# Patient Record
Sex: Female | Born: 1955
Health system: Southern US, Community
[De-identification: ages and names within clinical notes are randomized; demographics above are authoritative.]

## PROBLEM LIST (undated history)

## (undated) DIAGNOSIS — C9110 Chronic lymphocytic leukemia of B-cell type not having achieved remission: Secondary | ICD-10-CM

## (undated) DIAGNOSIS — I8392 Asymptomatic varicose veins of left lower extremity: Secondary | ICD-10-CM

## (undated) HISTORY — DX: Chronic lymphocytic leukemia of B-cell type not having achieved remission: C91.10

## (undated) HISTORY — PX: SHOULDER SURGERY: SHX246

## (undated) HISTORY — DX: Asymptomatic varicose veins of left lower extremity: I83.92

## (undated) HISTORY — PX: TUBAL LIGATION: SHX77

## (undated) HISTORY — PX: ELBOW SURGERY: SHX618

---

## 1998-05-03 ENCOUNTER — Other Ambulatory Visit: Admission: RE | Admit: 1998-05-03 | Discharge: 1998-05-03 | Payer: Self-pay | Admitting: Gynecology

## 1998-07-10 ENCOUNTER — Encounter: Payer: Self-pay | Admitting: General Surgery

## 1998-07-10 ENCOUNTER — Ambulatory Visit (HOSPITAL_COMMUNITY): Admission: RE | Admit: 1998-07-10 | Discharge: 1998-07-10 | Payer: Self-pay | Admitting: General Surgery

## 1999-07-05 ENCOUNTER — Other Ambulatory Visit: Admission: RE | Admit: 1999-07-05 | Discharge: 1999-07-05 | Payer: Self-pay | Admitting: Gynecology

## 1999-08-30 ENCOUNTER — Encounter: Admission: RE | Admit: 1999-08-30 | Discharge: 1999-08-30 | Payer: Self-pay | Admitting: Gynecology

## 1999-08-30 ENCOUNTER — Encounter: Payer: Self-pay | Admitting: Gynecology

## 1999-10-15 ENCOUNTER — Other Ambulatory Visit: Admission: RE | Admit: 1999-10-15 | Discharge: 1999-10-15 | Payer: Self-pay | Admitting: Gynecology

## 2000-10-01 ENCOUNTER — Encounter: Payer: Self-pay | Admitting: Gynecology

## 2000-10-01 ENCOUNTER — Encounter: Admission: RE | Admit: 2000-10-01 | Discharge: 2000-10-01 | Payer: Self-pay | Admitting: Gynecology

## 2000-11-11 ENCOUNTER — Other Ambulatory Visit: Admission: RE | Admit: 2000-11-11 | Discharge: 2000-11-11 | Payer: Self-pay | Admitting: Gynecology

## 2001-11-30 ENCOUNTER — Encounter: Payer: Self-pay | Admitting: Gynecology

## 2001-11-30 ENCOUNTER — Encounter: Admission: RE | Admit: 2001-11-30 | Discharge: 2001-11-30 | Payer: Self-pay | Admitting: Gynecology

## 2002-01-05 ENCOUNTER — Other Ambulatory Visit: Admission: RE | Admit: 2002-01-05 | Discharge: 2002-01-05 | Payer: Self-pay | Admitting: Gynecology

## 2003-03-29 ENCOUNTER — Encounter: Admission: RE | Admit: 2003-03-29 | Discharge: 2003-03-29 | Payer: Self-pay | Admitting: Gynecology

## 2003-03-29 ENCOUNTER — Encounter: Payer: Self-pay | Admitting: Gynecology

## 2003-03-29 ENCOUNTER — Other Ambulatory Visit: Admission: RE | Admit: 2003-03-29 | Discharge: 2003-03-29 | Payer: Self-pay | Admitting: Gynecology

## 2004-05-22 ENCOUNTER — Encounter: Admission: RE | Admit: 2004-05-22 | Discharge: 2004-05-22 | Payer: Self-pay | Admitting: Gynecology

## 2004-05-22 ENCOUNTER — Other Ambulatory Visit: Admission: RE | Admit: 2004-05-22 | Discharge: 2004-05-22 | Payer: Self-pay | Admitting: Gynecology

## 2005-08-20 ENCOUNTER — Other Ambulatory Visit: Admission: RE | Admit: 2005-08-20 | Discharge: 2005-08-20 | Payer: Self-pay | Admitting: Gynecology

## 2005-08-20 ENCOUNTER — Encounter: Admission: RE | Admit: 2005-08-20 | Discharge: 2005-08-20 | Payer: Self-pay | Admitting: Gynecology

## 2006-09-01 ENCOUNTER — Encounter: Admission: RE | Admit: 2006-09-01 | Discharge: 2006-09-01 | Payer: Self-pay | Admitting: Gynecology

## 2006-09-01 ENCOUNTER — Other Ambulatory Visit: Admission: RE | Admit: 2006-09-01 | Discharge: 2006-09-01 | Payer: Self-pay | Admitting: Gynecology

## 2007-12-10 ENCOUNTER — Other Ambulatory Visit: Admission: RE | Admit: 2007-12-10 | Discharge: 2007-12-10 | Payer: Self-pay | Admitting: Gynecology

## 2008-02-03 ENCOUNTER — Encounter: Admission: RE | Admit: 2008-02-03 | Discharge: 2008-02-03 | Payer: Self-pay | Admitting: Gynecology

## 2009-03-08 ENCOUNTER — Encounter: Admission: RE | Admit: 2009-03-08 | Discharge: 2009-03-08 | Payer: Self-pay | Admitting: General Surgery

## 2009-03-22 ENCOUNTER — Encounter: Admission: RE | Admit: 2009-03-22 | Discharge: 2009-03-22 | Payer: Self-pay | Admitting: General Surgery

## 2010-08-05 ENCOUNTER — Encounter: Payer: Self-pay | Admitting: General Surgery

## 2011-09-10 ENCOUNTER — Other Ambulatory Visit: Payer: Self-pay | Admitting: Gynecology

## 2011-09-10 DIAGNOSIS — R928 Other abnormal and inconclusive findings on diagnostic imaging of breast: Secondary | ICD-10-CM

## 2011-09-12 ENCOUNTER — Ambulatory Visit
Admission: RE | Admit: 2011-09-12 | Discharge: 2011-09-12 | Disposition: A | Discharge status: Home / Self Care | Payer: BC Managed Care – PPO | Source: Ambulatory Visit | Attending: Gynecology | Admitting: Gynecology

## 2011-09-12 DIAGNOSIS — R928 Other abnormal and inconclusive findings on diagnostic imaging of breast: Secondary | ICD-10-CM

## 2015-09-28 ENCOUNTER — Other Ambulatory Visit: Payer: Self-pay | Admitting: Obstetrics and Gynecology

## 2015-09-28 DIAGNOSIS — N6459 Other signs and symptoms in breast: Secondary | ICD-10-CM

## 2015-10-04 ENCOUNTER — Other Ambulatory Visit: Payer: Self-pay

## 2015-10-16 ENCOUNTER — Ambulatory Visit
Admission: RE | Admit: 2015-10-16 | Discharge: 2015-10-16 | Disposition: A | Discharge status: Home / Self Care | Payer: BLUE CROSS/BLUE SHIELD | Source: Ambulatory Visit | Attending: Obstetrics and Gynecology | Admitting: Obstetrics and Gynecology

## 2015-10-16 DIAGNOSIS — N6459 Other signs and symptoms in breast: Secondary | ICD-10-CM

## 2017-07-21 DIAGNOSIS — J189 Pneumonia, unspecified organism: Secondary | ICD-10-CM | POA: Diagnosis not present

## 2017-07-21 DIAGNOSIS — Z6841 Body Mass Index (BMI) 40.0 and over, adult: Secondary | ICD-10-CM | POA: Diagnosis not present

## 2017-07-21 DIAGNOSIS — R7989 Other specified abnormal findings of blood chemistry: Secondary | ICD-10-CM | POA: Diagnosis not present

## 2017-08-18 DIAGNOSIS — J343 Hypertrophy of nasal turbinates: Secondary | ICD-10-CM | POA: Diagnosis not present

## 2017-08-18 DIAGNOSIS — R590 Localized enlarged lymph nodes: Secondary | ICD-10-CM | POA: Diagnosis not present

## 2017-08-18 DIAGNOSIS — J301 Allergic rhinitis due to pollen: Secondary | ICD-10-CM | POA: Diagnosis not present

## 2017-08-18 DIAGNOSIS — J3489 Other specified disorders of nose and nasal sinuses: Secondary | ICD-10-CM | POA: Diagnosis not present

## 2017-08-25 ENCOUNTER — Ambulatory Visit (INDEPENDENT_AMBULATORY_CARE_PROVIDER_SITE_OTHER): Payer: BLUE CROSS/BLUE SHIELD

## 2017-08-25 ENCOUNTER — Ambulatory Visit (INDEPENDENT_AMBULATORY_CARE_PROVIDER_SITE_OTHER): Payer: BLUE CROSS/BLUE SHIELD | Admitting: Podiatry

## 2017-08-25 ENCOUNTER — Encounter: Payer: Self-pay | Admitting: Podiatry

## 2017-08-25 VITALS — Ht 67.0 in | Wt 275.0 lb

## 2017-08-25 DIAGNOSIS — M79671 Pain in right foot: Secondary | ICD-10-CM

## 2017-08-25 DIAGNOSIS — M779 Enthesopathy, unspecified: Secondary | ICD-10-CM | POA: Diagnosis not present

## 2017-09-01 ENCOUNTER — Ambulatory Visit: Payer: Self-pay | Admitting: Podiatry

## 2017-09-08 NOTE — Progress Notes (Signed)
  Subjective:  Patient ID: Amy Good, female    DOB: 03-01-1956,  MRN: [id number]  Chief Complaint  Patient presents with  . Foot Pain    outside right foot pain up and across the top   aches and throbs on going for 1 year    62 y.o. female presents with the above complaint.  Reports pain in the outside of the right foot and on top of the foot.  States that things drop all the time.  Has been going on for greater than 1 year.  Denies prior treatments.  No past medical history on file.  Current Outpatient Medications:  Marland Kitchen  Vitamin D, Ergocalciferol, (DRISDOL) 50000 units CAPS capsule, TAKE 1 (ONE) CAPSULE BY MOUTH BY MOUTH ONCE WEEKLY, Disp: , Rfl: 1  No Known Allergies Review of Systems Objective:  There were no vitals filed for this visit. General AA&O x3. Normal mood and affect.  Vascular Dorsalis pedis and posterior tibial pulses  present 2+ bilaterally  Capillary refill normal to all digits. Pedal hair growth normal.  Neurologic Epicritic sensation grossly present.  Dermatologic No open lesions. Interspaces clear of maceration. Nails well groomed and normal in appearance.  Orthopedic: MMT 5/5 in dorsiflexion, plantarflexion, inversion, and eversion. Normal joint ROM without pain or crepitus. Pain to palpation right fifth metatarsal base   Assessment & Plan:  Patient was evaluated and treated and all questions answered.  R 5th Metatarsal base enthesitis -X-rays taken and reviewed.  No acute osseous abnormality.  Fifth metatarsal base and the site is noted -Educated on padding.  Padding was placed -No injection today.  Would consider in future  Return in about 4 weeks (around 09/22/2017) for Tendonitis, Enthesitis f/u.

## 2017-09-11 ENCOUNTER — Other Ambulatory Visit: Payer: Self-pay | Admitting: Podiatry

## 2017-09-11 DIAGNOSIS — M79671 Pain in right foot: Secondary | ICD-10-CM

## 2017-09-11 DIAGNOSIS — M779 Enthesopathy, unspecified: Secondary | ICD-10-CM

## 2017-09-15 DIAGNOSIS — R591 Generalized enlarged lymph nodes: Secondary | ICD-10-CM | POA: Diagnosis not present

## 2017-09-15 DIAGNOSIS — R590 Localized enlarged lymph nodes: Secondary | ICD-10-CM | POA: Diagnosis not present

## 2017-09-22 DIAGNOSIS — R590 Localized enlarged lymph nodes: Secondary | ICD-10-CM | POA: Diagnosis not present

## 2017-09-22 DIAGNOSIS — J343 Hypertrophy of nasal turbinates: Secondary | ICD-10-CM | POA: Diagnosis not present

## 2017-09-22 DIAGNOSIS — J301 Allergic rhinitis due to pollen: Secondary | ICD-10-CM | POA: Diagnosis not present

## 2017-09-23 ENCOUNTER — Ambulatory Visit: Payer: BLUE CROSS/BLUE SHIELD | Admitting: Podiatry

## 2017-09-24 DIAGNOSIS — C9110 Chronic lymphocytic leukemia of B-cell type not having achieved remission: Secondary | ICD-10-CM | POA: Diagnosis not present

## 2017-09-24 DIAGNOSIS — R590 Localized enlarged lymph nodes: Secondary | ICD-10-CM | POA: Diagnosis not present

## 2017-09-24 DIAGNOSIS — C8300 Small cell B-cell lymphoma, unspecified site: Secondary | ICD-10-CM | POA: Diagnosis not present

## 2017-10-20 DIAGNOSIS — C9110 Chronic lymphocytic leukemia of B-cell type not having achieved remission: Secondary | ICD-10-CM | POA: Diagnosis not present

## 2017-12-01 DIAGNOSIS — K8020 Calculus of gallbladder without cholecystitis without obstruction: Secondary | ICD-10-CM | POA: Diagnosis not present

## 2017-12-01 DIAGNOSIS — K449 Diaphragmatic hernia without obstruction or gangrene: Secondary | ICD-10-CM | POA: Diagnosis not present

## 2017-12-01 DIAGNOSIS — C9110 Chronic lymphocytic leukemia of B-cell type not having achieved remission: Secondary | ICD-10-CM | POA: Diagnosis not present

## 2017-12-01 DIAGNOSIS — I700 Atherosclerosis of aorta: Secondary | ICD-10-CM | POA: Diagnosis not present

## 2017-12-03 DIAGNOSIS — C9190 Lymphoid leukemia, unspecified not having achieved remission: Secondary | ICD-10-CM | POA: Diagnosis not present

## 2017-12-03 DIAGNOSIS — Z9889 Other specified postprocedural states: Secondary | ICD-10-CM | POA: Diagnosis not present

## 2017-12-03 DIAGNOSIS — C9110 Chronic lymphocytic leukemia of B-cell type not having achieved remission: Secondary | ICD-10-CM | POA: Diagnosis not present

## 2017-12-03 DIAGNOSIS — R590 Localized enlarged lymph nodes: Secondary | ICD-10-CM | POA: Diagnosis not present

## 2017-12-03 DIAGNOSIS — R591 Generalized enlarged lymph nodes: Secondary | ICD-10-CM | POA: Diagnosis not present

## 2018-01-21 DIAGNOSIS — C9190 Lymphoid leukemia, unspecified not having achieved remission: Secondary | ICD-10-CM | POA: Diagnosis not present

## 2018-01-21 DIAGNOSIS — R591 Generalized enlarged lymph nodes: Secondary | ICD-10-CM | POA: Diagnosis not present

## 2018-01-21 DIAGNOSIS — C9110 Chronic lymphocytic leukemia of B-cell type not having achieved remission: Secondary | ICD-10-CM | POA: Diagnosis not present

## 2018-02-11 DIAGNOSIS — L821 Other seborrheic keratosis: Secondary | ICD-10-CM | POA: Diagnosis not present

## 2018-02-11 DIAGNOSIS — Z08 Encounter for follow-up examination after completed treatment for malignant neoplasm: Secondary | ICD-10-CM | POA: Diagnosis not present

## 2018-02-11 DIAGNOSIS — B078 Other viral warts: Secondary | ICD-10-CM | POA: Diagnosis not present

## 2018-02-11 DIAGNOSIS — Z85828 Personal history of other malignant neoplasm of skin: Secondary | ICD-10-CM | POA: Diagnosis not present

## 2018-02-16 DIAGNOSIS — D3132 Benign neoplasm of left choroid: Secondary | ICD-10-CM | POA: Diagnosis not present

## 2018-02-16 DIAGNOSIS — H524 Presbyopia: Secondary | ICD-10-CM | POA: Diagnosis not present

## 2018-02-19 DIAGNOSIS — Z01419 Encounter for gynecological examination (general) (routine) without abnormal findings: Secondary | ICD-10-CM | POA: Diagnosis not present

## 2018-02-19 DIAGNOSIS — Z6841 Body Mass Index (BMI) 40.0 and over, adult: Secondary | ICD-10-CM | POA: Diagnosis not present

## 2018-02-19 DIAGNOSIS — Z1231 Encounter for screening mammogram for malignant neoplasm of breast: Secondary | ICD-10-CM | POA: Diagnosis not present

## 2018-02-23 ENCOUNTER — Other Ambulatory Visit: Payer: Self-pay | Admitting: Obstetrics and Gynecology

## 2018-02-23 DIAGNOSIS — R928 Other abnormal and inconclusive findings on diagnostic imaging of breast: Secondary | ICD-10-CM

## 2018-02-27 ENCOUNTER — Ambulatory Visit
Admission: RE | Admit: 2018-02-27 | Discharge: 2018-02-27 | Disposition: A | Discharge status: Home / Self Care | Payer: BLUE CROSS/BLUE SHIELD | Source: Ambulatory Visit | Attending: Obstetrics and Gynecology | Admitting: Obstetrics and Gynecology

## 2018-02-27 DIAGNOSIS — R928 Other abnormal and inconclusive findings on diagnostic imaging of breast: Secondary | ICD-10-CM

## 2018-03-09 DIAGNOSIS — Z6841 Body Mass Index (BMI) 40.0 and over, adult: Secondary | ICD-10-CM | POA: Diagnosis not present

## 2018-03-09 DIAGNOSIS — F410 Panic disorder [episodic paroxysmal anxiety] without agoraphobia: Secondary | ICD-10-CM | POA: Diagnosis not present

## 2018-03-09 DIAGNOSIS — Z1331 Encounter for screening for depression: Secondary | ICD-10-CM | POA: Diagnosis not present

## 2018-03-10 DIAGNOSIS — C9190 Lymphoid leukemia, unspecified not having achieved remission: Secondary | ICD-10-CM | POA: Diagnosis not present

## 2018-03-30 DIAGNOSIS — E785 Hyperlipidemia, unspecified: Secondary | ICD-10-CM | POA: Diagnosis not present

## 2018-03-30 DIAGNOSIS — Z6841 Body Mass Index (BMI) 40.0 and over, adult: Secondary | ICD-10-CM | POA: Diagnosis not present

## 2018-03-30 DIAGNOSIS — F410 Panic disorder [episodic paroxysmal anxiety] without agoraphobia: Secondary | ICD-10-CM | POA: Diagnosis not present

## 2018-04-13 DIAGNOSIS — C9190 Lymphoid leukemia, unspecified not having achieved remission: Secondary | ICD-10-CM | POA: Diagnosis not present

## 2018-04-13 DIAGNOSIS — R590 Localized enlarged lymph nodes: Secondary | ICD-10-CM | POA: Diagnosis not present

## 2018-04-13 DIAGNOSIS — D72820 Lymphocytosis (symptomatic): Secondary | ICD-10-CM | POA: Diagnosis not present

## 2018-04-20 DIAGNOSIS — C9110 Chronic lymphocytic leukemia of B-cell type not having achieved remission: Secondary | ICD-10-CM | POA: Diagnosis not present

## 2018-06-17 DIAGNOSIS — J0100 Acute maxillary sinusitis, unspecified: Secondary | ICD-10-CM | POA: Diagnosis not present

## 2018-06-22 DIAGNOSIS — D2271 Melanocytic nevi of right lower limb, including hip: Secondary | ICD-10-CM | POA: Diagnosis not present

## 2018-06-22 DIAGNOSIS — D1801 Hemangioma of skin and subcutaneous tissue: Secondary | ICD-10-CM | POA: Diagnosis not present

## 2018-06-22 DIAGNOSIS — Z08 Encounter for follow-up examination after completed treatment for malignant neoplasm: Secondary | ICD-10-CM | POA: Diagnosis not present

## 2018-06-22 DIAGNOSIS — Z85828 Personal history of other malignant neoplasm of skin: Secondary | ICD-10-CM | POA: Diagnosis not present

## 2018-06-29 DIAGNOSIS — K5100 Ulcerative (chronic) pancolitis without complications: Secondary | ICD-10-CM | POA: Diagnosis not present

## 2018-07-14 DIAGNOSIS — D125 Benign neoplasm of sigmoid colon: Secondary | ICD-10-CM | POA: Diagnosis not present

## 2018-07-14 DIAGNOSIS — K219 Gastro-esophageal reflux disease without esophagitis: Secondary | ICD-10-CM | POA: Diagnosis not present

## 2018-07-14 DIAGNOSIS — K529 Noninfective gastroenteritis and colitis, unspecified: Secondary | ICD-10-CM | POA: Diagnosis not present

## 2018-07-14 DIAGNOSIS — K5190 Ulcerative colitis, unspecified, without complications: Secondary | ICD-10-CM | POA: Diagnosis not present

## 2018-07-14 DIAGNOSIS — R1310 Dysphagia, unspecified: Secondary | ICD-10-CM | POA: Diagnosis not present

## 2018-07-14 DIAGNOSIS — K449 Diaphragmatic hernia without obstruction or gangrene: Secondary | ICD-10-CM | POA: Diagnosis not present

## 2018-07-14 DIAGNOSIS — D126 Benign neoplasm of colon, unspecified: Secondary | ICD-10-CM | POA: Diagnosis not present

## 2018-07-14 DIAGNOSIS — K644 Residual hemorrhoidal skin tags: Secondary | ICD-10-CM | POA: Diagnosis not present

## 2018-07-14 DIAGNOSIS — K222 Esophageal obstruction: Secondary | ICD-10-CM | POA: Diagnosis not present

## 2018-07-14 DIAGNOSIS — C9110 Chronic lymphocytic leukemia of B-cell type not having achieved remission: Secondary | ICD-10-CM | POA: Diagnosis not present

## 2018-07-14 DIAGNOSIS — K5150 Left sided colitis without complications: Secondary | ICD-10-CM | POA: Diagnosis not present

## 2018-09-21 DIAGNOSIS — D126 Benign neoplasm of colon, unspecified: Secondary | ICD-10-CM | POA: Diagnosis not present

## 2018-10-12 DIAGNOSIS — C9110 Chronic lymphocytic leukemia of B-cell type not having achieved remission: Secondary | ICD-10-CM | POA: Diagnosis not present

## 2018-12-21 DIAGNOSIS — Z08 Encounter for follow-up examination after completed treatment for malignant neoplasm: Secondary | ICD-10-CM | POA: Diagnosis not present

## 2018-12-21 DIAGNOSIS — L821 Other seborrheic keratosis: Secondary | ICD-10-CM | POA: Diagnosis not present

## 2018-12-21 DIAGNOSIS — Z85828 Personal history of other malignant neoplasm of skin: Secondary | ICD-10-CM | POA: Diagnosis not present

## 2018-12-21 DIAGNOSIS — L820 Inflamed seborrheic keratosis: Secondary | ICD-10-CM | POA: Diagnosis not present

## 2018-12-21 DIAGNOSIS — D485 Neoplasm of uncertain behavior of skin: Secondary | ICD-10-CM | POA: Diagnosis not present

## 2019-01-25 DIAGNOSIS — C9110 Chronic lymphocytic leukemia of B-cell type not having achieved remission: Secondary | ICD-10-CM | POA: Diagnosis not present

## 2019-01-29 DIAGNOSIS — K5140 Inflammatory polyps of colon without complications: Secondary | ICD-10-CM | POA: Diagnosis not present

## 2019-01-29 DIAGNOSIS — K529 Noninfective gastroenteritis and colitis, unspecified: Secondary | ICD-10-CM | POA: Diagnosis not present

## 2019-01-29 DIAGNOSIS — K635 Polyp of colon: Secondary | ICD-10-CM | POA: Diagnosis not present

## 2019-01-29 DIAGNOSIS — K5150 Left sided colitis without complications: Secondary | ICD-10-CM | POA: Diagnosis not present

## 2019-01-29 DIAGNOSIS — Z8719 Personal history of other diseases of the digestive system: Secondary | ICD-10-CM | POA: Diagnosis not present

## 2019-01-29 DIAGNOSIS — Z86010 Personal history of colonic polyps: Secondary | ICD-10-CM | POA: Diagnosis not present

## 2019-02-01 DIAGNOSIS — C9111 Chronic lymphocytic leukemia of B-cell type in remission: Secondary | ICD-10-CM | POA: Diagnosis not present

## 2019-02-01 DIAGNOSIS — R599 Enlarged lymph nodes, unspecified: Secondary | ICD-10-CM | POA: Diagnosis not present

## 2019-02-01 DIAGNOSIS — R591 Generalized enlarged lymph nodes: Secondary | ICD-10-CM | POA: Diagnosis not present

## 2019-03-12 DIAGNOSIS — R509 Fever, unspecified: Secondary | ICD-10-CM | POA: Diagnosis not present

## 2019-03-12 DIAGNOSIS — J40 Bronchitis, not specified as acute or chronic: Secondary | ICD-10-CM | POA: Diagnosis not present

## 2019-03-12 DIAGNOSIS — J329 Chronic sinusitis, unspecified: Secondary | ICD-10-CM | POA: Diagnosis not present

## 2019-04-13 DIAGNOSIS — R0602 Shortness of breath: Secondary | ICD-10-CM | POA: Diagnosis not present

## 2019-04-13 DIAGNOSIS — E041 Nontoxic single thyroid nodule: Secondary | ICD-10-CM | POA: Diagnosis not present

## 2019-04-13 DIAGNOSIS — D649 Anemia, unspecified: Secondary | ICD-10-CM | POA: Diagnosis not present

## 2019-04-13 DIAGNOSIS — G4733 Obstructive sleep apnea (adult) (pediatric): Secondary | ICD-10-CM | POA: Diagnosis not present

## 2019-04-13 DIAGNOSIS — R609 Edema, unspecified: Secondary | ICD-10-CM | POA: Diagnosis not present

## 2019-04-13 DIAGNOSIS — R5383 Other fatigue: Secondary | ICD-10-CM | POA: Diagnosis not present

## 2019-04-13 DIAGNOSIS — I10 Essential (primary) hypertension: Secondary | ICD-10-CM | POA: Diagnosis not present

## 2019-04-13 DIAGNOSIS — K5190 Ulcerative colitis, unspecified, without complications: Secondary | ICD-10-CM | POA: Diagnosis not present

## 2019-04-13 DIAGNOSIS — R0600 Dyspnea, unspecified: Secondary | ICD-10-CM | POA: Diagnosis not present

## 2019-04-13 DIAGNOSIS — C9110 Chronic lymphocytic leukemia of B-cell type not having achieved remission: Secondary | ICD-10-CM | POA: Diagnosis not present

## 2019-04-20 DIAGNOSIS — R600 Localized edema: Secondary | ICD-10-CM | POA: Diagnosis not present

## 2019-04-20 DIAGNOSIS — Z1331 Encounter for screening for depression: Secondary | ICD-10-CM | POA: Diagnosis not present

## 2019-04-20 DIAGNOSIS — Z6841 Body Mass Index (BMI) 40.0 and over, adult: Secondary | ICD-10-CM | POA: Diagnosis not present

## 2019-04-27 DIAGNOSIS — J00 Acute nasopharyngitis [common cold]: Secondary | ICD-10-CM | POA: Diagnosis not present

## 2019-05-04 DIAGNOSIS — R600 Localized edema: Secondary | ICD-10-CM | POA: Diagnosis not present

## 2019-05-04 DIAGNOSIS — Z6841 Body Mass Index (BMI) 40.0 and over, adult: Secondary | ICD-10-CM | POA: Diagnosis not present

## 2019-05-04 DIAGNOSIS — Z1331 Encounter for screening for depression: Secondary | ICD-10-CM | POA: Diagnosis not present

## 2019-05-18 DIAGNOSIS — I119 Hypertensive heart disease without heart failure: Secondary | ICD-10-CM | POA: Diagnosis not present

## 2019-05-18 DIAGNOSIS — C9110 Chronic lymphocytic leukemia of B-cell type not having achieved remission: Secondary | ICD-10-CM | POA: Diagnosis not present

## 2019-05-18 DIAGNOSIS — G4733 Obstructive sleep apnea (adult) (pediatric): Secondary | ICD-10-CM | POA: Diagnosis not present

## 2019-05-18 DIAGNOSIS — E041 Nontoxic single thyroid nodule: Secondary | ICD-10-CM | POA: Diagnosis not present

## 2019-05-18 DIAGNOSIS — R0600 Dyspnea, unspecified: Secondary | ICD-10-CM | POA: Diagnosis not present

## 2019-05-18 DIAGNOSIS — D801 Nonfamilial hypogammaglobulinemia: Secondary | ICD-10-CM | POA: Diagnosis not present

## 2019-05-18 DIAGNOSIS — D649 Anemia, unspecified: Secondary | ICD-10-CM | POA: Diagnosis not present

## 2019-05-18 DIAGNOSIS — R5383 Other fatigue: Secondary | ICD-10-CM | POA: Diagnosis not present

## 2019-06-22 DIAGNOSIS — Z20828 Contact with and (suspected) exposure to other viral communicable diseases: Secondary | ICD-10-CM | POA: Diagnosis not present

## 2019-06-22 DIAGNOSIS — J329 Chronic sinusitis, unspecified: Secondary | ICD-10-CM | POA: Diagnosis not present

## 2019-06-22 DIAGNOSIS — J40 Bronchitis, not specified as acute or chronic: Secondary | ICD-10-CM | POA: Diagnosis not present

## 2019-06-28 DIAGNOSIS — Z85828 Personal history of other malignant neoplasm of skin: Secondary | ICD-10-CM | POA: Diagnosis not present

## 2019-06-28 DIAGNOSIS — Z08 Encounter for follow-up examination after completed treatment for malignant neoplasm: Secondary | ICD-10-CM | POA: Diagnosis not present

## 2019-06-28 DIAGNOSIS — R591 Generalized enlarged lymph nodes: Secondary | ICD-10-CM | POA: Diagnosis not present

## 2019-06-28 DIAGNOSIS — I781 Nevus, non-neoplastic: Secondary | ICD-10-CM | POA: Diagnosis not present

## 2019-06-28 DIAGNOSIS — L821 Other seborrheic keratosis: Secondary | ICD-10-CM | POA: Diagnosis not present

## 2019-07-01 DIAGNOSIS — Z13820 Encounter for screening for osteoporosis: Secondary | ICD-10-CM | POA: Diagnosis not present

## 2019-07-01 DIAGNOSIS — N760 Acute vaginitis: Secondary | ICD-10-CM | POA: Diagnosis not present

## 2019-07-01 DIAGNOSIS — Z6841 Body Mass Index (BMI) 40.0 and over, adult: Secondary | ICD-10-CM | POA: Diagnosis not present

## 2019-07-01 DIAGNOSIS — Z01419 Encounter for gynecological examination (general) (routine) without abnormal findings: Secondary | ICD-10-CM | POA: Diagnosis not present

## 2019-07-01 DIAGNOSIS — Z1231 Encounter for screening mammogram for malignant neoplasm of breast: Secondary | ICD-10-CM | POA: Diagnosis not present

## 2019-07-02 DIAGNOSIS — R591 Generalized enlarged lymph nodes: Secondary | ICD-10-CM | POA: Diagnosis not present

## 2019-07-02 DIAGNOSIS — D649 Anemia, unspecified: Secondary | ICD-10-CM | POA: Diagnosis not present

## 2019-07-02 DIAGNOSIS — C9110 Chronic lymphocytic leukemia of B-cell type not having achieved remission: Secondary | ICD-10-CM | POA: Diagnosis not present

## 2019-07-05 ENCOUNTER — Other Ambulatory Visit: Payer: Self-pay | Admitting: Obstetrics and Gynecology

## 2019-07-05 DIAGNOSIS — R928 Other abnormal and inconclusive findings on diagnostic imaging of breast: Secondary | ICD-10-CM

## 2019-07-07 DIAGNOSIS — J329 Chronic sinusitis, unspecified: Secondary | ICD-10-CM | POA: Diagnosis not present

## 2019-07-07 DIAGNOSIS — J40 Bronchitis, not specified as acute or chronic: Secondary | ICD-10-CM | POA: Diagnosis not present

## 2019-07-15 ENCOUNTER — Ambulatory Visit
Admission: RE | Admit: 2019-07-15 | Discharge: 2019-07-15 | Disposition: A | Discharge status: Home / Self Care | Payer: BLUE CROSS/BLUE SHIELD | Source: Ambulatory Visit | Attending: Obstetrics and Gynecology | Admitting: Obstetrics and Gynecology

## 2019-07-15 ENCOUNTER — Ambulatory Visit
Admission: RE | Admit: 2019-07-15 | Discharge: 2019-07-15 | Disposition: A | Discharge status: Home / Self Care | Source: Ambulatory Visit | Attending: Obstetrics and Gynecology | Admitting: Obstetrics and Gynecology

## 2019-07-15 ENCOUNTER — Other Ambulatory Visit: Payer: Self-pay

## 2019-07-15 DIAGNOSIS — N6001 Solitary cyst of right breast: Secondary | ICD-10-CM | POA: Diagnosis not present

## 2019-07-15 DIAGNOSIS — N6314 Unspecified lump in the right breast, lower inner quadrant: Secondary | ICD-10-CM | POA: Diagnosis not present

## 2019-07-15 DIAGNOSIS — R928 Other abnormal and inconclusive findings on diagnostic imaging of breast: Secondary | ICD-10-CM

## 2019-07-15 DIAGNOSIS — R922 Inconclusive mammogram: Secondary | ICD-10-CM | POA: Diagnosis not present

## 2019-08-04 DIAGNOSIS — R0989 Other specified symptoms and signs involving the circulatory and respiratory systems: Secondary | ICD-10-CM | POA: Diagnosis not present

## 2019-08-04 DIAGNOSIS — D801 Nonfamilial hypogammaglobulinemia: Secondary | ICD-10-CM | POA: Diagnosis not present

## 2019-08-04 DIAGNOSIS — M8580 Other specified disorders of bone density and structure, unspecified site: Secondary | ICD-10-CM | POA: Diagnosis not present

## 2019-08-04 DIAGNOSIS — C9110 Chronic lymphocytic leukemia of B-cell type not having achieved remission: Secondary | ICD-10-CM | POA: Diagnosis not present

## 2019-08-04 DIAGNOSIS — D649 Anemia, unspecified: Secondary | ICD-10-CM | POA: Diagnosis not present

## 2019-08-29 DIAGNOSIS — S2239XA Fracture of one rib, unspecified side, initial encounter for closed fracture: Secondary | ICD-10-CM | POA: Diagnosis not present

## 2019-08-29 DIAGNOSIS — S299XXA Unspecified injury of thorax, initial encounter: Secondary | ICD-10-CM | POA: Diagnosis not present

## 2019-08-29 DIAGNOSIS — W109XXA Fall (on) (from) unspecified stairs and steps, initial encounter: Secondary | ICD-10-CM | POA: Diagnosis not present

## 2019-08-29 DIAGNOSIS — R0781 Pleurodynia: Secondary | ICD-10-CM | POA: Diagnosis not present

## 2019-08-29 DIAGNOSIS — S2231XA Fracture of one rib, right side, initial encounter for closed fracture: Secondary | ICD-10-CM | POA: Diagnosis not present

## 2019-10-21 DIAGNOSIS — D649 Anemia, unspecified: Secondary | ICD-10-CM | POA: Diagnosis not present

## 2019-10-21 DIAGNOSIS — D801 Nonfamilial hypogammaglobulinemia: Secondary | ICD-10-CM | POA: Diagnosis not present

## 2019-11-03 DIAGNOSIS — D709 Neutropenia, unspecified: Secondary | ICD-10-CM | POA: Diagnosis not present

## 2020-09-25 ENCOUNTER — Other Ambulatory Visit: Payer: Self-pay | Admitting: Obstetrics and Gynecology

## 2020-09-25 DIAGNOSIS — R928 Other abnormal and inconclusive findings on diagnostic imaging of breast: Secondary | ICD-10-CM

## 2020-10-11 ENCOUNTER — Ambulatory Visit
Admission: RE | Admit: 2020-10-11 | Discharge: 2020-10-11 | Disposition: A | Discharge status: Home / Self Care | Payer: Medicare Other | Source: Ambulatory Visit | Attending: Obstetrics and Gynecology | Admitting: Obstetrics and Gynecology

## 2020-10-11 ENCOUNTER — Other Ambulatory Visit: Payer: Self-pay | Admitting: Obstetrics and Gynecology

## 2020-10-11 ENCOUNTER — Other Ambulatory Visit: Payer: Self-pay

## 2020-10-11 DIAGNOSIS — R928 Other abnormal and inconclusive findings on diagnostic imaging of breast: Secondary | ICD-10-CM

## 2020-10-28 IMAGING — MG MM DIGITAL DIAGNOSTIC UNILAT*R* W/ TOMO W/ CAD
8 series · 9 of 24 positions shown · non-contrast
Comparison: 07/01/2019 and earlier

CLINICAL DATA: Patient returns after screening study for evaluation
possible RIGHT breast asymmetry and possible RIGHT breast mass.

EXAM:
DIGITAL DIAGNOSTIC RIGHT MAMMOGRAM WITH CAD AND TOMO
ULTRASOUND RIGHT BREAST

[R MLO synth-2D (1 of 2)]
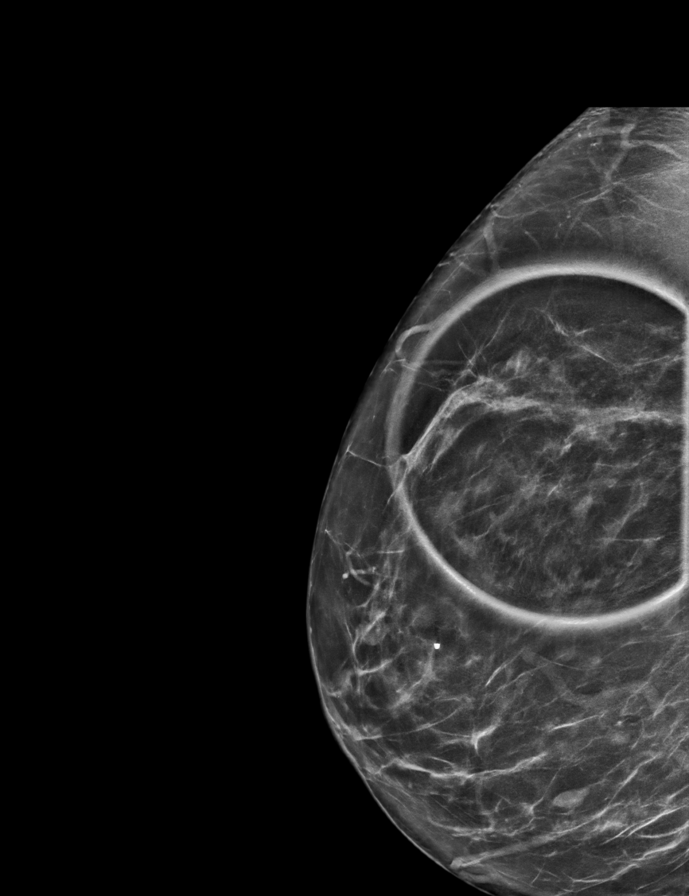

[R CC synth-2D (1 of 2)]
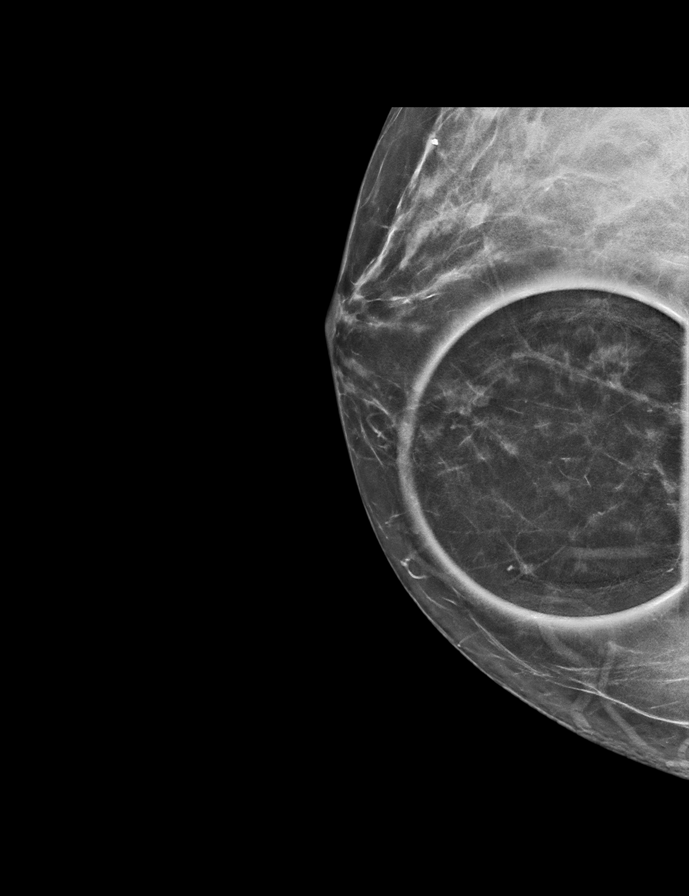

[R CC synth-2D (2 of 2)]
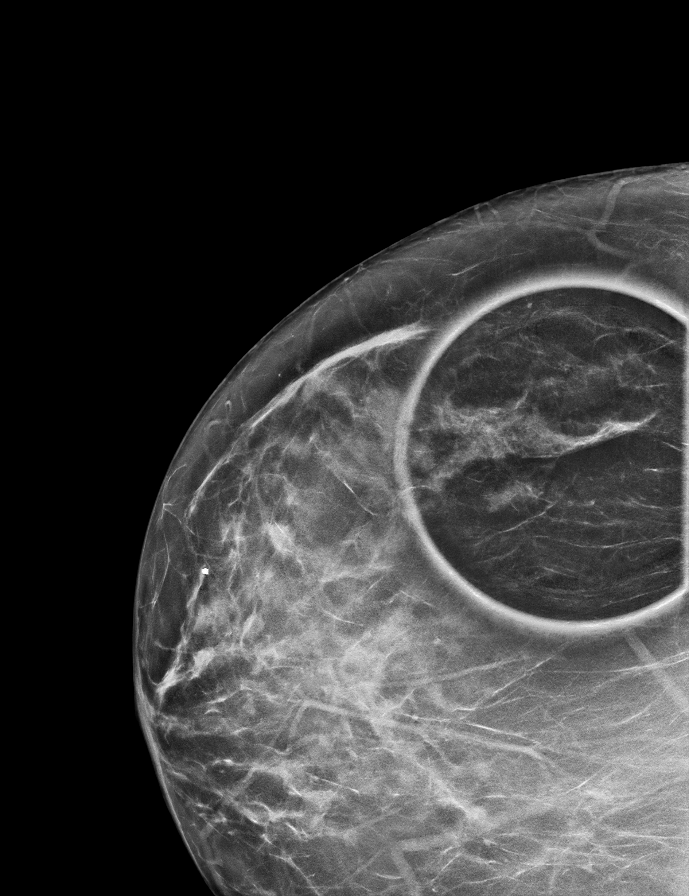

[R MLO synth-2D (2 of 2)]
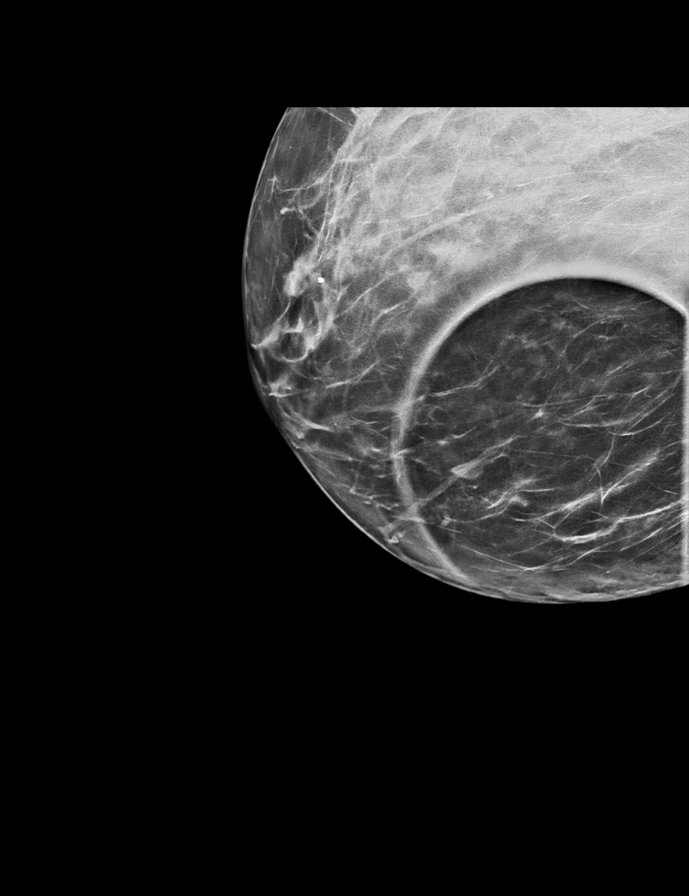

[R CC tomo · 2 of 56 frames shown (1 of 2)]
[frame 19/56]
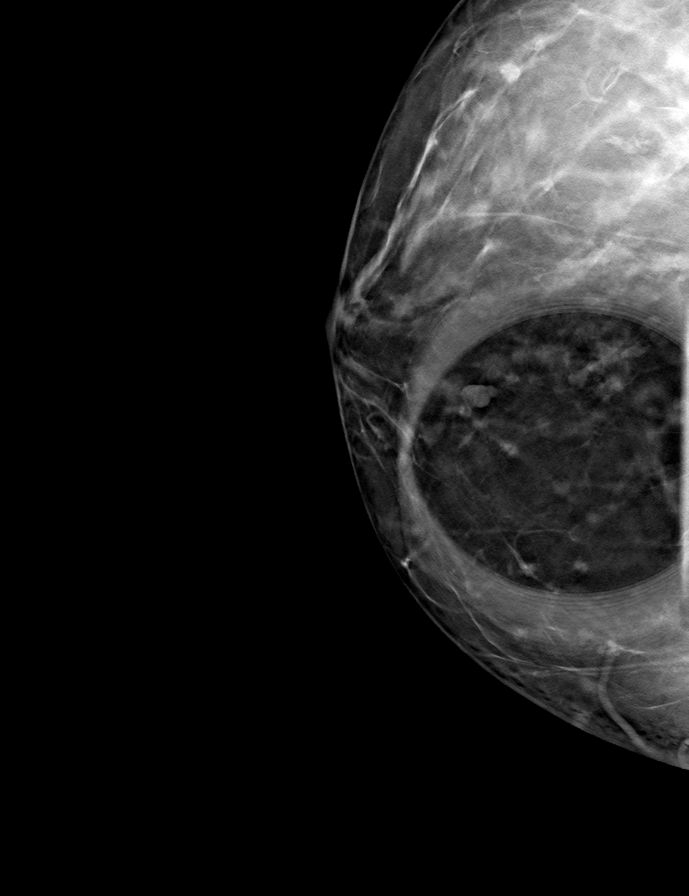
[frame 29/56]
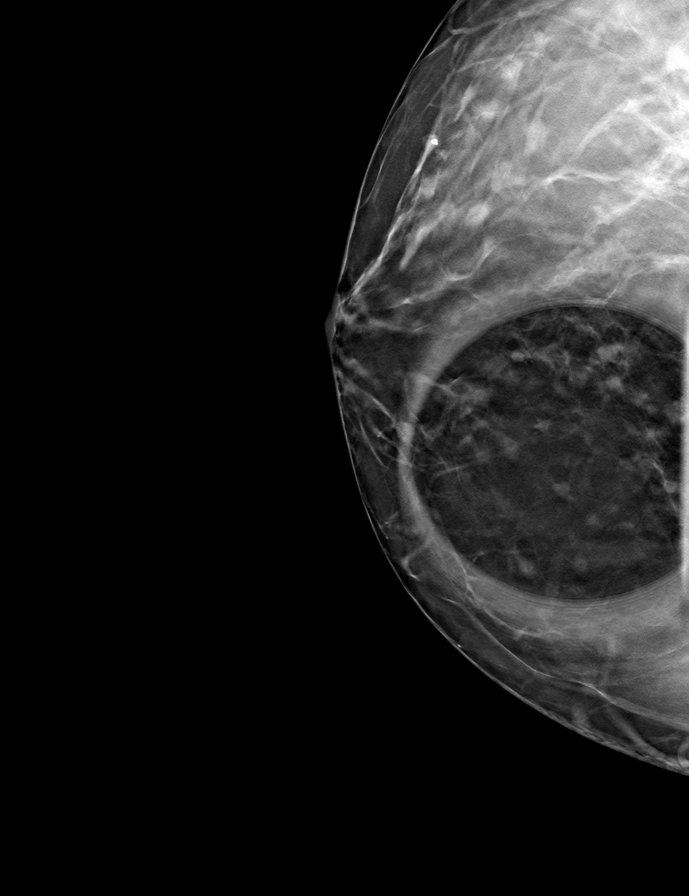

[R MLO tomo (1 of 2) · tomo slice 37/72.0]
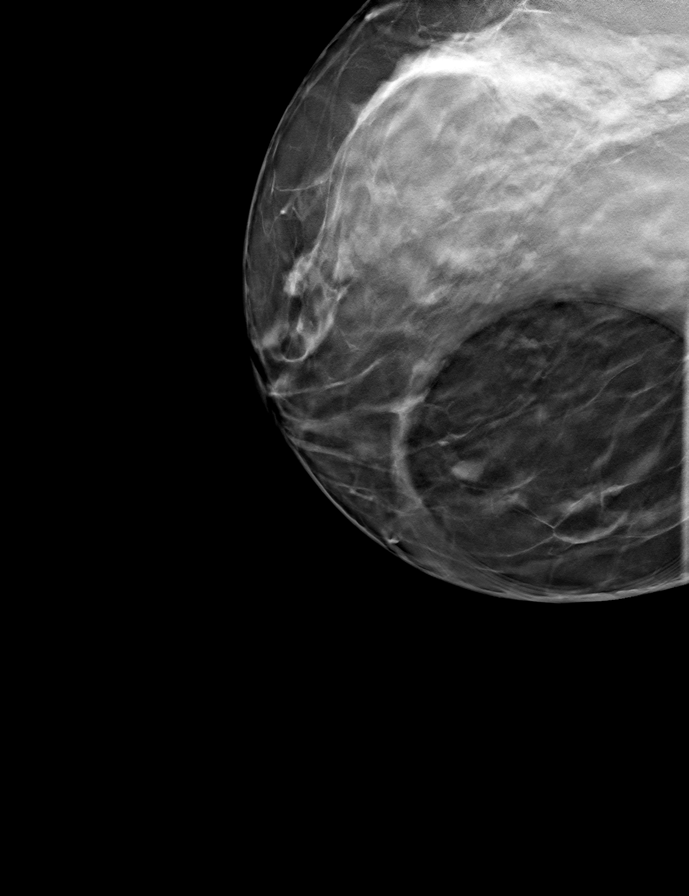

[R MLO tomo (2 of 2) · tomo slice 41/82.0]
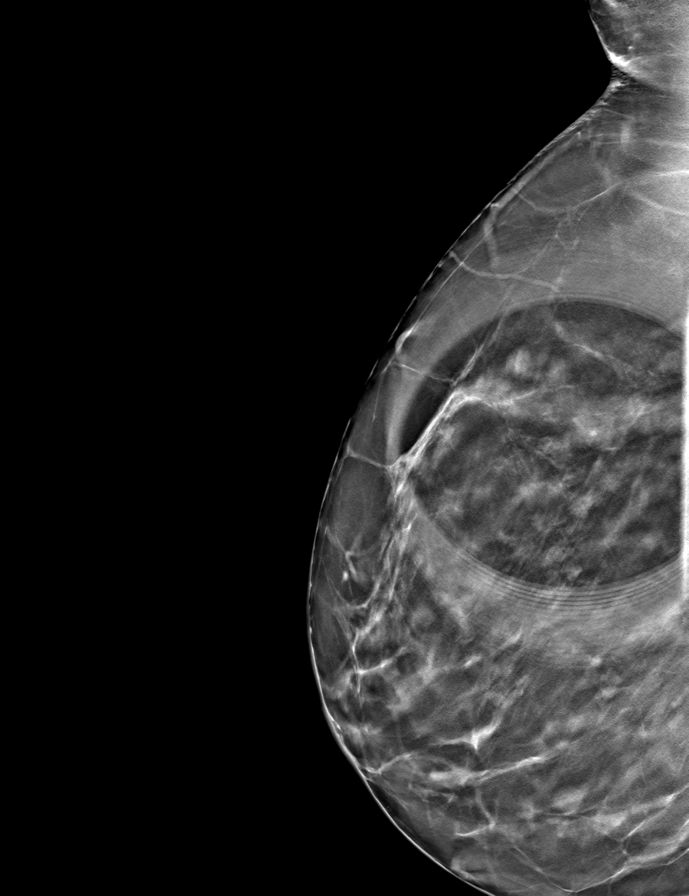

[R CC tomo (2 of 2) · tomo slice 34/67.0]
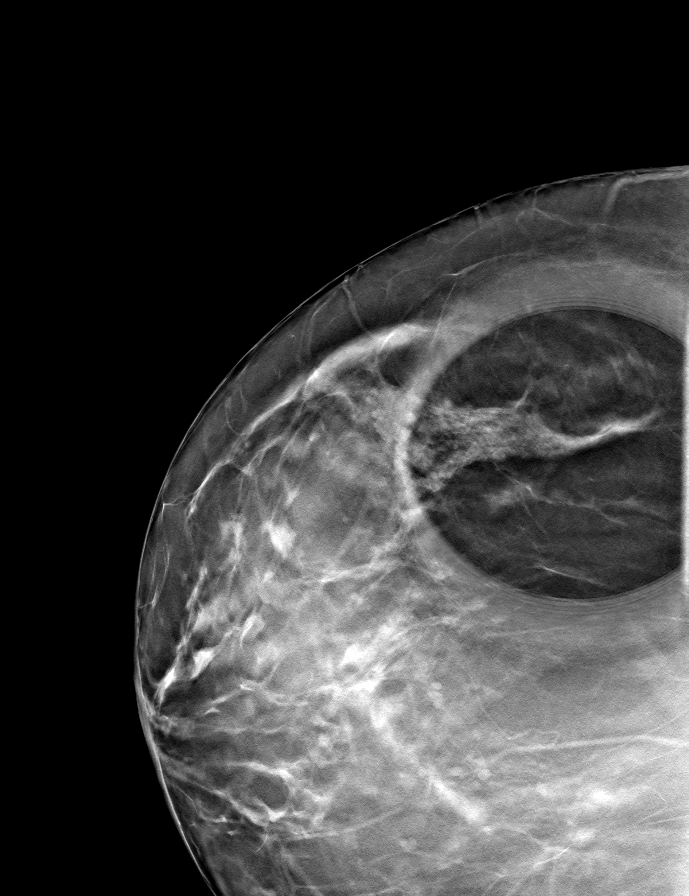

[9 of 24 positions shown; findings below may reference images not displayed]

ACR Breast Density Category c: The breast tissue is heterogeneously
dense, which may obscure small masses.
FINDINGS: Additional 2-D and 3-D images are performed. These view show normal
appearing fibroglandular tissue in the UPPER-OUTER QUADRANT without
evidence for asymmetry.

Spot compression views of the LOWER INNER QUADRANT confirm presence
of a circumscribed oval mass less than 1 centimeter in diameter.

Mammographic images were processed with CAD.

Targeted ultrasound is performed, showing a small cyst in the 5
o'clock location of the RIGHT breast 2 centimeters from the nipple
measuring 0.5 x 0.4 x 0.6 centimeters. No solid masses or areas of
acoustic shadowing.
IMPRESSION: No mammographic or ultrasound evidence for malignancy.

RECOMMENDATION:
Screening mammogram in one year.(Code:T8-G-ZIV)

I have discussed the findings and recommendations with the patient.
If applicable, a reminder letter will be sent to the patient
regarding the next appointment.

BI-RADS CATEGORY  2: Benign.

## 2021-04-09 ENCOUNTER — Other Ambulatory Visit: Payer: Self-pay | Admitting: *Deleted

## 2021-04-09 DIAGNOSIS — M25562 Pain in left knee: Secondary | ICD-10-CM

## 2021-04-12 ENCOUNTER — Ambulatory Visit (HOSPITAL_COMMUNITY)
Admission: RE | Admit: 2021-04-12 | Discharge: 2021-04-12 | Disposition: A | Discharge status: Home / Self Care | Payer: Medicare Other | Source: Ambulatory Visit | Attending: Vascular Surgery | Admitting: Vascular Surgery

## 2021-04-12 ENCOUNTER — Ambulatory Visit (INDEPENDENT_AMBULATORY_CARE_PROVIDER_SITE_OTHER): Payer: Medicare Other | Admitting: Physician Assistant

## 2021-04-12 ENCOUNTER — Other Ambulatory Visit: Payer: Self-pay

## 2021-04-12 VITALS — BP 156/84 | HR 75 | Temp 97.7°F | Resp 16 | Ht 68.0 in | Wt 265.0 lb

## 2021-04-12 DIAGNOSIS — M25562 Pain in left knee: Secondary | ICD-10-CM | POA: Diagnosis present

## 2021-04-12 DIAGNOSIS — I83893 Varicose veins of bilateral lower extremities with other complications: Secondary | ICD-10-CM

## 2021-04-12 DIAGNOSIS — M7989 Other specified soft tissue disorders: Secondary | ICD-10-CM

## 2021-04-12 NOTE — Progress Notes (Signed)
Office Note     CC:  follow up Requesting Provider:  Ronita Hipps, MD  HPI: Amy Good is a 65 y.o. ([birth year]) female who presents for evaluation of left lower extremity varicose veins.  She has occasional pain in areas of varicosities however these episodes have been less frequent since her recent 60 pound weight loss.  She denies any history of DVT, venous ulcerations, trauma, or prior vascular intervention.  She attempted to wear compression in the past however felt they were too tight.  She does not elevate her legs much during the day.  She also denies claudication, rest pain, or nonhealing wounds of bilateral lower extremities.  She denies tobacco use.   Past Medical History:  Diagnosis Date   CLL (chronic lymphocytic leukemia) ([place])    Varicose veins of left lower extremity     Past Surgical History:  Procedure Laterality Date   ELBOW SURGERY Right    SHOULDER SURGERY Left    TUBAL LIGATION      Social History   Socioeconomic History   Marital status: Married    Spouse name: Not on file   Number of children: Not on file   Years of education: Not on file   Highest education level: Not on file  Occupational History   Not on file  Tobacco Use   Smoking status: Never   Smokeless tobacco: Never  Substance and Sexual Activity   Alcohol use: Not on file   Drug use: Not on file   Sexual activity: Not on file  Other Topics Concern   Not on file  Social History Narrative   Not on file   Social Determinants of Health   Financial Resource Strain: Not on file  Food Insecurity: Not on file  Transportation Needs: Not on file  Physical Activity: Not on file  Stress: Not on file  Social Connections: Not on file  Intimate Partner Violence: Not on file    Family History  Problem Relation Age of Onset   Hypertension Mother    Hypertension Father    Diabetes Father    Heart failure Father     Current Outpatient Medications  Medication Sig Dispense Refill    albuterol (VENTOLIN HFA) 108 (90 Base) MCG/ACT inhaler Inhale into the lungs.     entecavir (BARACLUDE) 0.5 MG tablet Take 0.5 mg by mouth daily.     ferrous sulfate 325 (65 FE) MG tablet Take by mouth.     Multiple Vitamins-Minerals (CENTRUM SILVER 50+WOMEN) TABS      valACYclovir (VALTREX) 500 MG tablet Take 500 mg by mouth 2 (two) times daily.     Vitamin D, Ergocalciferol, (DRISDOL) 50000 units CAPS capsule TAKE 1 (ONE) CAPSULE BY MOUTH BY MOUTH ONCE WEEKLY  1   No current facility-administered medications for this visit.    Allergies  Allergen Reactions   Other Other (See Comments)    Weeds. Trees and Grass- nose congestion and stuffiness     REVIEW OF SYSTEMS:   [X]  denotes positive finding, [ ]  denotes negative finding Cardiac  Comments:  Chest pain or chest pressure:    Shortness of breath upon exertion:    Short of breath when lying flat:    Irregular heart rhythm:        Vascular    Pain in calf, thigh, or hip brought on by ambulation:    Pain in feet at night that wakes you up from your sleep:     Blood clot in  your veins:    Leg swelling:         Pulmonary    Oxygen at home:    Productive cough:     Wheezing:         Neurologic    Sudden weakness in arms or legs:     Sudden numbness in arms or legs:     Sudden onset of difficulty speaking or slurred speech:    Temporary loss of vision in one eye:     Problems with dizziness:         Gastrointestinal    Blood in stool:     Vomited blood:         Genitourinary    Burning when urinating:     Blood in urine:        Psychiatric    Major depression:         Hematologic    Bleeding problems:    Problems with blood clotting too easily:        Skin    Rashes or ulcers:        Constitutional    Fever or chills:      PHYSICAL EXAMINATION:  Vitals:   04/12/21 1036  BP: (!) 156/84  Pulse: 75  Resp: 16  Temp: 97.7 F (36.5 C)  TempSrc: Temporal  SpO2: 94%  Weight: 265 lb (120.2 kg)   Height: 5\' 8"  (1.727 m)    General:  WDWN in NAD; vital signs documented above Gait: Not observed HENT: WNL, normocephalic Pulmonary: normal non-labored breathing , without Rales, rhonchi,  wheezing Cardiac: regular HR Abdomen: soft, NT, no masses Skin: without rashes Vascular Exam/Pulses:  Right Left  Radial 2+ (normal) 2+ (normal)  DP 2+ (normal) 2+ (normal)   Extremities: without ischemic changes, without Gangrene , without cellulitis; without open wounds; spider veins most prominent right knee; varicose veins across left anterior thigh and lateral knee and lower leg Musculoskeletal: no muscle wasting or atrophy  Neurologic: A&O X 3;  No focal weakness or paresthesias are detected Psychiatric:  The pt has Normal affect.   Non-Invasive Vascular Imaging:   Left lower extremity venous reflux study negative for DVT Negative for deep or superficial venous reflux    ASSESSMENT/PLAN:: 65 y.o. female here for evaluation of left lower extremity varicose veins  -Patient states painful episodes involving varicose veins has decreased in frequency since her recent weight loss -Left lower extremity venous reflux studies negative for DVT, deep reflux, and superficial venous reflux -Encourage patient to continue weight loss to help outflow of the venous system from bilateral lower extremities -Recommendations also include 15 to 20 mmHg knee-high compression which should be worn on a daily basis, periodic elevation of the legs above the heart during the day, and avoiding prolonged sitting and standing.  Patient can also use NSAIDs when venous symptoms are at their peak.  Patient was measured for and purchased knee-high compression today. -Patient can otherwise follow-up on an as-needed basis   Dagoberto Ligas, PA-C [facility] [phone]  Clinic MD:   Scot Dock

## 2021-04-17 ENCOUNTER — Other Ambulatory Visit: Payer: Self-pay

## 2021-04-17 ENCOUNTER — Ambulatory Visit
Admission: RE | Admit: 2021-04-17 | Discharge: 2021-04-17 | Disposition: A | Discharge status: Home / Self Care | Payer: Medicare Other | Source: Ambulatory Visit | Attending: Obstetrics and Gynecology | Admitting: Obstetrics and Gynecology

## 2021-04-17 ENCOUNTER — Other Ambulatory Visit: Payer: Self-pay | Admitting: Obstetrics and Gynecology

## 2021-04-17 DIAGNOSIS — R928 Other abnormal and inconclusive findings on diagnostic imaging of breast: Secondary | ICD-10-CM

## 2021-10-23 ENCOUNTER — Ambulatory Visit
Admission: RE | Admit: 2021-10-23 | Discharge: 2021-10-23 | Disposition: A | Discharge status: Home / Self Care | Payer: Medicare Other | Source: Ambulatory Visit | Attending: Obstetrics and Gynecology | Admitting: Obstetrics and Gynecology

## 2021-10-23 DIAGNOSIS — R928 Other abnormal and inconclusive findings on diagnostic imaging of breast: Secondary | ICD-10-CM

## 2022-08-13 ENCOUNTER — Telehealth: Payer: Self-pay

## 2022-08-13 NOTE — Patient Outreach (Signed)
  Care Coordination   Initial Visit Note   08/13/2022 Name: Amy Good MRN: [id number] DOB: 1956/04/24  Amy Good is a 67 y.o. year old female who sees Ronita Hipps, MD for primary care. I spoke with  Lenon Oms by phone today.  What matters to the patients health and wellness today?  Placed call to patient to review and offer [facility] care coordination program. Patient reports she is doing well. Reports she goes to [facility] for treatment of her CLL.  Denies any current needs.   SDOH assessments and interventions completed:  No     Care Coordination Interventions:  No, not indicated   Follow up plan: No further intervention required.   Encounter Outcome:  Pt. Refused   Tomasa Rand, RN, BSN, CEN [facility] [employer] [phone]

## 2022-08-13 NOTE — Patient Outreach (Unsigned)
{  CARE COORDINATION [id number]}

## 2022-11-18 ENCOUNTER — Other Ambulatory Visit: Payer: Self-pay | Admitting: Obstetrics and Gynecology

## 2022-11-18 ENCOUNTER — Encounter: Payer: Self-pay | Admitting: Obstetrics and Gynecology

## 2022-11-18 DIAGNOSIS — N6310 Unspecified lump in the right breast, unspecified quadrant: Secondary | ICD-10-CM

## 2022-12-11 ENCOUNTER — Ambulatory Visit
Admission: RE | Admit: 2022-12-11 | Discharge: 2022-12-11 | Disposition: A | Discharge status: Home / Self Care | Payer: Medicare Other | Source: Ambulatory Visit | Attending: Obstetrics and Gynecology | Admitting: Obstetrics and Gynecology

## 2022-12-11 DIAGNOSIS — N6310 Unspecified lump in the right breast, unspecified quadrant: Secondary | ICD-10-CM
